# Patient Record
Sex: Female | Born: 1990 | Race: Black or African American | Hispanic: Yes | Marital: Single | Smoking: Never smoker
Health system: Southern US, Community
[De-identification: ages and names within clinical notes are randomized; demographics above are authoritative.]

## PROBLEM LIST (undated history)

## (undated) DIAGNOSIS — E282 Polycystic ovarian syndrome: Secondary | ICD-10-CM

---

## 2018-02-19 ENCOUNTER — Other Ambulatory Visit: Payer: Self-pay

## 2018-02-19 ENCOUNTER — Emergency Department (HOSPITAL_COMMUNITY)
Admission: EM | Admit: 2018-02-19 | Discharge: 2018-02-19 | Disposition: A | Discharge status: Home / Self Care | Payer: Self-pay | Attending: Emergency Medicine | Admitting: Emergency Medicine

## 2018-02-19 ENCOUNTER — Encounter (HOSPITAL_COMMUNITY): Payer: Self-pay | Admitting: Emergency Medicine

## 2018-02-19 DIAGNOSIS — O4690 Antepartum hemorrhage, unspecified, unspecified trimester: Secondary | ICD-10-CM

## 2018-02-19 DIAGNOSIS — Z3A01 Less than 8 weeks gestation of pregnancy: Secondary | ICD-10-CM | POA: Insufficient documentation

## 2018-02-19 DIAGNOSIS — O208 Other hemorrhage in early pregnancy: Secondary | ICD-10-CM | POA: Insufficient documentation

## 2018-02-19 HISTORY — DX: Polycystic ovarian syndrome: E28.2

## 2018-02-19 LAB — CBC
HEMATOCRIT: 40.8 % (ref 36.0–46.0)
Hemoglobin: 13.3 g/dL (ref 12.0–15.0)
MCH: 27.8 pg (ref 26.0–34.0)
MCHC: 32.6 g/dL (ref 30.0–36.0)
MCV: 85.4 fL (ref 80.0–100.0)
PLATELETS: 310 10*3/uL (ref 150–400)
RBC: 4.78 MIL/uL (ref 3.87–5.11)
RDW: 12.2 % (ref 11.5–15.5)
WBC: 4.3 10*3/uL (ref 4.0–10.5)
nRBC: 0 % (ref 0.0–0.2)

## 2018-02-19 LAB — ABO/RH: ABO/RH(D): O POS

## 2018-02-19 LAB — HCG, QUANTITATIVE, PREGNANCY: HCG, BETA CHAIN, QUANT, S: 19 m[IU]/mL — AB (ref <5)

## 2018-02-19 NOTE — ED Triage Notes (Signed)
Pt reports she was told on Monday that she is [redacted] weeks pregnant and was having abd cramping then, was told was normal. Pt reports has pinkish bleeding on tissue when wiping that started today.

## 2018-02-19 NOTE — ED Provider Notes (Signed)
[facility]-EMERGENCY DEPT Provider Note   CSN: [id number] Arrival date & time: 02/19/18  1735     History   Chief Complaint Chief Complaint  Patient presents with  . Abdominal Pain  . Vaginal Bleeding    HPI Erika Dennis is a 27 y.o. female who presents to the ED with c/o vaginal bleeding in early pregnancy. Patient reports she found out she was pregnant 5 days ago and estimated 6 weeks. Patient had Bhcg done at that time and told it was 98. Patient was to return in 48 hours and have it repeated. She did but does not know the results. Earlier today patient began having spotting but now it is bleeding like a period. Earlier patient reports she had bad cramps in her lower abdomen but now she has no pain.   HPI  Past Medical History:  Diagnosis Date  . PCOS (polycystic ovarian syndrome)     There are no active problems to display for this patient.   History reviewed. No pertinent surgical history.   OB History    Gravida  1   Para      Term      Preterm      AB      Living        SAB      TAB      Ectopic      Multiple      Live Births               Home Medications    Prior to Admission medications   Not on File    Family History No family history on file.  Social History Social History   Tobacco Use  . Smoking status: Never Smoker  . Smokeless tobacco: Never Used  Substance Use Topics  . Alcohol use: Not on file  . Drug use: Not on file     Allergies   Penicillins   Review of Systems Review of Systems  Gastrointestinal: Positive for abdominal pain.  Genitourinary: Positive for vaginal bleeding.  All other systems reviewed and are negative.    Physical Exam Updated Vital Signs BP 119/81 (BP Location: Left Arm)   Pulse 86   Temp 98.6 F (37 C) (Oral)   Resp 18   LMP 11/29/2017   SpO2 97%   Physical Exam  Constitutional: No distress.  Obese  HENT:  Head: Normocephalic.  Eyes: EOM are normal.    Neck: Neck supple.  Cardiovascular: Normal rate.  Pulmonary/Chest: Effort normal.  Abdominal: Soft. There is no tenderness.  Genitourinary:  Genitourinary Comments: External genitalia without lesions, small blood vaginal vault, cervix closed, no CMT, no adnexal tenderness. Unable to palpate uterus due to patient habitus.   Musculoskeletal: Normal range of motion.  Neurological: She is alert.  Skin: Skin is warm and dry.  Psychiatric: She has a normal mood and affect.  Nursing note and vitals reviewed.    ED Treatments / Results  Labs (all labs ordered are listed, but only abnormal results are displayed) Labs Reviewed  HCG, QUANTITATIVE, PREGNANCY - Abnormal; Notable for the following components:      Result Value   hCG, Beta Chain, Quant, S 19 (*)    All other components within normal limits  CBC  ABO/RH   Radiology No results found.  Procedures Procedures (including critical care time)  Medications Ordered in ED Medications - No data to display   Initial Impression / Assessment and Plan / ED Course  I have reviewed the triage vital signs and the nursing notes.  27 y.o. female G1 @ [redacted] weeks gestation by LMP here with vaginal bleeding and cramping stable for d/c without heavy bleeding and pain has resolved. Discussed with the patient lab results and most likely failed pregnancy. If symptoms worsen patient will go to [facility].   Final Clinical Impressions(s) / ED Diagnoses   Final diagnoses:  Vaginal bleeding in pregnancy    ED Discharge Orders    None       Kerrie [place], [place] 02/19/18 2221    Alvira Monday, MD 02/21/18 1523

## 2018-02-19 NOTE — Discharge Instructions (Addendum)
I have sent a message to Dr. Cherly Hensen regarding you visit today. They may call you on Monday or you can call them to schedule f/u. If you have problems before then go to [facility] Admissions.

## 2018-12-06 ENCOUNTER — Other Ambulatory Visit: Payer: Self-pay

## 2018-12-06 ENCOUNTER — Emergency Department (HOSPITAL_BASED_OUTPATIENT_CLINIC_OR_DEPARTMENT_OTHER)
Admission: EM | Admit: 2018-12-06 | Discharge: 2018-12-06 | Disposition: A | Discharge status: Home / Self Care | Payer: Self-pay | Attending: Emergency Medicine | Admitting: Emergency Medicine

## 2018-12-06 ENCOUNTER — Encounter (HOSPITAL_BASED_OUTPATIENT_CLINIC_OR_DEPARTMENT_OTHER): Payer: Self-pay | Admitting: *Deleted

## 2018-12-06 DIAGNOSIS — B3731 Acute candidiasis of vulva and vagina: Secondary | ICD-10-CM

## 2018-12-06 DIAGNOSIS — N760 Acute vaginitis: Secondary | ICD-10-CM | POA: Insufficient documentation

## 2018-12-06 DIAGNOSIS — B9689 Other specified bacterial agents as the cause of diseases classified elsewhere: Secondary | ICD-10-CM

## 2018-12-06 DIAGNOSIS — N939 Abnormal uterine and vaginal bleeding, unspecified: Secondary | ICD-10-CM

## 2018-12-06 DIAGNOSIS — B373 Candidiasis of vulva and vagina: Secondary | ICD-10-CM | POA: Insufficient documentation

## 2018-12-06 DIAGNOSIS — R030 Elevated blood-pressure reading, without diagnosis of hypertension: Secondary | ICD-10-CM | POA: Insufficient documentation

## 2018-12-06 LAB — WET PREP, GENITAL
Sperm: NONE SEEN
Trich, Wet Prep: NONE SEEN

## 2018-12-06 LAB — CBC WITH DIFFERENTIAL/PLATELET
Abs Immature Granulocytes: 0.01 10*3/uL (ref 0.00–0.07)
Basophils Absolute: 0 10*3/uL (ref 0.0–0.1)
Basophils Relative: 1 %
Eosinophils Absolute: 0.2 10*3/uL (ref 0.0–0.5)
Eosinophils Relative: 3 %
HCT: 37.3 % (ref 36.0–46.0)
Hemoglobin: 12.2 g/dL (ref 12.0–15.0)
Immature Granulocytes: 0 %
Lymphocytes Relative: 41 %
Lymphs Abs: 2.3 10*3/uL (ref 0.7–4.0)
MCH: 28.7 pg (ref 26.0–34.0)
MCHC: 32.7 g/dL (ref 30.0–36.0)
MCV: 87.8 fL (ref 80.0–100.0)
Monocytes Absolute: 0.5 10*3/uL (ref 0.1–1.0)
Monocytes Relative: 9 %
Neutro Abs: 2.6 10*3/uL (ref 1.7–7.7)
Neutrophils Relative %: 46 %
Platelets: 279 10*3/uL (ref 150–400)
RBC: 4.25 MIL/uL (ref 3.87–5.11)
RDW: 12.2 % (ref 11.5–15.5)
WBC: 5.6 10*3/uL (ref 4.0–10.5)
nRBC: 0 % (ref 0.0–0.2)

## 2018-12-06 LAB — BASIC METABOLIC PANEL
Anion gap: 10 (ref 5–15)
BUN: 12 mg/dL (ref 6–20)
CO2: 26 mmol/L (ref 22–32)
Calcium: 9 mg/dL (ref 8.9–10.3)
Chloride: 103 mmol/L (ref 98–111)
Creatinine, Ser: 0.77 mg/dL (ref 0.44–1.00)
GFR calc Af Amer: >60 mL/min (ref >60)
GFR calc non Af Amer: >60 mL/min (ref >60)
Glucose, Bld: 79 mg/dL (ref 70–99)
Potassium: 3.4 mmol/L — ABNORMAL LOW (ref 3.5–5.1)
Sodium: 139 mmol/L (ref 135–145)

## 2018-12-06 LAB — HCG, QUANTITATIVE, PREGNANCY: hCG, Beta Chain, Quant, S: <1 m[IU]/mL (ref <5)

## 2018-12-06 MED ORDER — METRONIDAZOLE 500 MG PO TABS: 500.0000 mg | ORAL_TABLET | Freq: Two times a day (BID) | ORAL | 0 refills | Status: AC

## 2018-12-06 MED ORDER — FLUCONAZOLE 150 MG PO TABS
150.0000 mg | ORAL_TABLET | Freq: Once | ORAL | Status: AC
  Administered 2018-12-06: 150 mg via ORAL
  Filled 2018-12-06: qty 1

## 2018-12-06 MED ORDER — METRONIDAZOLE 500 MG PO TABS
500.0000 mg | ORAL_TABLET | Freq: Once | ORAL | Status: AC
  Administered 2018-12-06: 500 mg via ORAL
  Filled 2018-12-06: qty 1

## 2018-12-06 NOTE — ED Notes (Signed)
Pt refused oral medications tonight, willing to start them in the morning. States "I don't like to start medications this late." Provided initial dose of flagyl and diflucan for home use.

## 2018-12-06 NOTE — ED Triage Notes (Signed)
Irregular menses. She had a positive home pregnancy test. She is having pink spotting. Hx of miscarriage in October 2019.

## 2018-12-06 NOTE — ED Notes (Signed)
Prompted to provide urine sample. Pt voided prior to arrival. Pants and underwear removed, pelvic set up for provider.

## 2018-12-06 NOTE — Discharge Instructions (Addendum)
Take medications as prescribed.  Follow-up with OB/GYN for reevaluation.  Return for any new worsening symptoms.

## 2018-12-06 NOTE — ED Provider Notes (Signed)
[facility] EMERGENCY DEPARTMENT Provider Note   CSN: [id number] Arrival date & time: 12/06/18  1857  History   Chief Complaint Chief Complaint  Patient presents with  . Vaginal Bleeding    HPI Erika Dennis is a 28 y.o. female with past medical history significant for PCOS who presents for evaluation of vaginal bleeding.  Patient states she had a faint positive pregnancy test approximately 4 days ago.  States she started with vaginal bleeding described as light spotting 2 days ago.  Patient states she has not had to wear a pad or a tampon because of bleeding is light.  She does have prior history of miscarriage in October 2019 she is a fraid that is what this is.  She is sexually active and does use protection.  Has had some mild clear/light white vaginal discharge.  Denies fever, chills, nausea, vomiting, chest pain, shortness of breath, abdominal pain, diarrhea, dysuria.  Additional aggravating or alleviating factors.  History obtained from patient and past medical records.  No interpreter is used.     HPI  Past Medical History:  Diagnosis Date  . PCOS (polycystic ovarian syndrome)     There are no active problems to display for this patient.   History reviewed. No pertinent surgical history.   OB History    Gravida  1   Para      Term      Preterm      AB      Living        SAB      TAB      Ectopic      Multiple      Live Births               Home Medications    Prior to Admission medications   Medication Sig Start Date End Date Taking? Authorizing Provider  metroNIDAZOLE (FLAGYL) 500 MG tablet Take 1 tablet (500 mg total) by mouth 2 (two) times daily. 12/06/18   Ziyan Schoon A, PA-C    Family History No family history on file.  Social History Social History   Tobacco Use  . Smoking status: Never Smoker  . Smokeless tobacco: Never Used  Substance Use Topics  . Alcohol use: Not Currently  . Drug use: Never     Allergies    Penicillins   Review of Systems Review of Systems  Constitutional: Negative.   HENT: Negative.   Respiratory: Negative.   Cardiovascular: Negative.   Gastrointestinal: Negative.   Genitourinary: Positive for menstrual problem and vaginal bleeding. Negative for decreased urine volume, difficulty urinating, dysuria, enuresis, flank pain, frequency, hematuria, pelvic pain, urgency, vaginal discharge and vaginal pain.  Musculoskeletal: Negative.   Skin: Negative.   Neurological: Negative.   All other systems reviewed and are negative.    Physical Exam Updated Vital Signs BP (!) 141/101   Pulse 83   Temp 98.4 F (36.9 C) (Oral)   Resp 20   Ht 5\' 6"  (1.676 m)   Wt 133.9 kg   SpO2 100%   Breastfeeding Unknown   BMI 47.65 kg/m   Physical Exam Vitals signs and nursing note reviewed. Exam conducted with a chaperone present.  Constitutional:      General: She is not in acute distress.    Appearance: She is well-developed. She is not ill-appearing or toxic-appearing.  HENT:     Head: Normocephalic and atraumatic.     Nose: Nose normal.     Mouth/Throat:  Mouth: Mucous membranes are moist.     Pharynx: Oropharynx is clear.  Eyes:     Pupils: Pupils are equal, round, and reactive to light.  Neck:     Musculoskeletal: Normal range of motion.  Cardiovascular:     Rate and Rhythm: Normal rate.     Pulses: Normal pulses.     Heart sounds: Normal heart sounds.  Pulmonary:     Effort: Pulmonary effort is normal. No respiratory distress.     Breath sounds: Normal breath sounds.  Abdominal:     General: There is no distension.     Comments: Soft, nontender without rebound or guarding.  Negative CVA tenderness.  No evidence of abdominal wall herniations.  Genitourinary:    Comments: Normal appearing external female genitalia without rashes or lesions, normal vaginal epithelium. Normal appearing cervix with white thin discharge. No petechiae. Cervical os is closed. There is  mild dark bleeding noted at the os. Mild Odor. Bimanual: No CMT, nontender.  No palpable adnexal masses or tenderness. Uterus midline and not fixed. Rectovaginal exam was deferred.  No cystocele or rectocele noted. No pelvic lymphadenopathy noted. Wet prep was obtained.  Cultures for gonorrhea and chlamydia collected. Exam performed with chaperone in room. Musculoskeletal: Normal range of motion.     Comments: Moves all 4 extremities without difficulty.  Skin:    General: Skin is warm and dry.     Comments: No rashes or lesions.  No redness, swelling or warmth.  Brisk capillary refill.  Neurological:     Mental Status: She is alert.     Comments: Cranial nerves II through XII grossly intact.  No facial droop.  Ambulatory that difficulty.      ED Treatments / Results  Labs (all labs ordered are listed, but only abnormal results are displayed) Labs Reviewed  WET PREP, GENITAL - Abnormal; Notable for the following components:      Result Value   Yeast Wet Prep HPF POC PRESENT (*)    Clue Cells Wet Prep HPF POC PRESENT (*)    WBC, Wet Prep HPF POC MODERATE (*)    All other components within normal limits  BASIC METABOLIC PANEL - Abnormal; Notable for the following components:   Potassium 3.4 (*)    All other components within normal limits  CBC WITH DIFFERENTIAL/PLATELET  HCG, QUANTITATIVE, PREGNANCY  URINALYSIS, ROUTINE W REFLEX MICROSCOPIC  GC/CHLAMYDIA PROBE AMP ([facility]) NOT [facility]    EKG None  Radiology No results found.  Procedures Procedures (including critical care time)  Medications Ordered in ED Medications  fluconazole (DIFLUCAN) tablet 150 mg (has no administration in time range)  metroNIDAZOLE (FLAGYL) tablet 500 mg (has no administration in time range)     Initial Impression / Assessment and Plan / ED Course  I have reviewed the triage vital signs and the nursing notes.  Pertinent labs & imaging results that were available during my care of the  patient were reviewed by me and considered in my medical decision making (see chart for details).  28 year old female appears otherwise well presents for evaluation of abnormal vaginal bleeding.  Faint positive pregnancy test 4 days ago.  Has not had to wear a pad or a tampon secondary to her light vaginal bleeding.  Abdomen soft, nontender without rebound or guarding.  GU exam within, white discharge in vaginal vault.  She does have mild dark old vaginal bleeding at the cervix.  No CMT tenderness, adnexal tenderness.  Low suspicion for PID, torsion,  TOA.  Heart and lungs clear.  Labs personally reviewed. CBC without leukocytosis, and above pain with mild hypokalemia at 3.4 Wet prep with yeast, BV and moderate WBC.  Patient does not want empiric antibiotics for GC/chlamydia. Pregnancy test negative Urinalysis--patient unable to provide sample.  Does not want to wait until she is able to urinate again.  Patient is nontoxic, nonseptic appearing, in no apparent distress.  Patient's pain and other symptoms adequately managed in emergency department.  Fluid bolus given.  Labs, imaging and vitals reviewed.  Patient does not meet the SIRS or Sepsis criteria.  On repeat exam patient does not have a surgical abdomin and there are no peritoneal signs.  No indication of appendicitis, bowel obstruction, bowel perforation, cholecystitis, diverticulitis, PID or ectopic pregnancy.   Pt understands that they have GC/Chlamydia cultures pending and that they will need to inform all sexual partners if results return positive. Pt has been treated prophylactically with azithromycin and Rocephin due to pts history, pelvic exam, and wet prep with increased WBCs. Pt has also been treated with Flagyl for Bacterial Vaginosis. Pt has been advised to not drink alcohol while on this medication.  Patient to be discharged with instructions to follow up with OBGYN/PCP. Discussed importance of using protection when sexually active.    Patient has had mildly elevated blood pressure in the department.  She is asymptomatic without headache, vision changes, unilateral weakness, nausea, vomiting, chest pain, shortness of breath.  I have low suspicion for hypertensive urgency or emergency.  I discussed with patient follow-up with PCP for reevaluation of her blood pressure.  The patient has been appropriately medically screened and/or stabilized in the ED. I have low suspicion for any other emergent medical condition which would require further screening, evaluation or treatment in the ED or require inpatient management.      Final Clinical Impressions(s) / ED Diagnoses   Final diagnoses:  Yeast vaginitis  Abnormal vaginal bleeding  Bacterial vaginosis  Elevated blood pressure reading    ED Discharge Orders         Ordered    metroNIDAZOLE (FLAGYL) 500 MG tablet  2 times daily     12/06/18 2202           Danija Gosa A, PA-C 12/06/18 2203    Vanetta MuldersZackowski, [place], MD 12/10/18 [phone]

## 2018-12-08 LAB — GC/CHLAMYDIA PROBE AMP (~~LOC~~) NOT AT ARMC
Chlamydia: NEGATIVE
Neisseria Gonorrhea: NEGATIVE

## 2020-06-06 ENCOUNTER — Encounter: Payer: Self-pay | Admitting: Obstetrics & Gynecology

## 2020-06-06 ENCOUNTER — Telehealth: Payer: Self-pay | Admitting: General Practice

## 2020-06-06 ENCOUNTER — Ambulatory Visit (INDEPENDENT_AMBULATORY_CARE_PROVIDER_SITE_OTHER): Payer: Self-pay | Admitting: Obstetrics & Gynecology

## 2020-06-06 ENCOUNTER — Other Ambulatory Visit (HOSPITAL_COMMUNITY)
Admission: RE | Admit: 2020-06-06 | Discharge: 2020-06-06 | Disposition: A | Discharge status: Home / Self Care | Payer: Self-pay | Source: Ambulatory Visit | Attending: Obstetrics & Gynecology | Admitting: Obstetrics & Gynecology

## 2020-06-06 ENCOUNTER — Other Ambulatory Visit: Payer: Self-pay

## 2020-06-06 VITALS — BP 120/79 | HR 79 | Ht 66.0 in | Wt 300.0 lb

## 2020-06-06 DIAGNOSIS — Z6841 Body Mass Index (BMI) 40.0 and over, adult: Secondary | ICD-10-CM

## 2020-06-06 DIAGNOSIS — N939 Abnormal uterine and vaginal bleeding, unspecified: Secondary | ICD-10-CM

## 2020-06-06 DIAGNOSIS — Z113 Encounter for screening for infections with a predominantly sexual mode of transmission: Secondary | ICD-10-CM | POA: Insufficient documentation

## 2020-06-06 DIAGNOSIS — E282 Polycystic ovarian syndrome: Secondary | ICD-10-CM

## 2020-06-06 DIAGNOSIS — Z01419 Encounter for gynecological examination (general) (routine) without abnormal findings: Secondary | ICD-10-CM

## 2020-06-06 NOTE — Patient Instructions (Signed)
Polycystic Ovary Syndrome  Polycystic ovarian syndrome (PCOS) is a common hormonal disorder among women of reproductive age. In most women with PCOS, small fluid-filled sacs (cysts) grow on the ovaries. PCOS can cause problems with menstrual periods and make it hard to get and stay pregnant. If this condition is not treated, it can lead to serious health problems, such as diabetes and heart disease. What are the causes? The cause of this condition is not known. It may be due to certain factors, such as:  Irregular menstrual cycle.  High levels of certain hormones.  Problems with the hormone that helps to control blood sugar (insulin).  Certain genes. What increases the risk? You are more likely to develop this condition if you:  Have a family history of PCOS or type 2 diabetes.  Are overweight, eat unhealthy foods, and are not active. These factors may cause problems with blood sugar control, which can contribute to PCOS or PCOS symptoms. What are the signs or symptoms? Symptoms of this condition include:  Ovarian cysts and sometimes pelvic pain.  Menstrual periods that are not regular or are too heavy.  Inability to get or stay pregnant.  Increased growth of hair on the face, chest, stomach, back, thumbs, thighs, or toes.  Acne or oily skin. Acne may develop during adulthood, and it may not get better with treatment.  Weight gain or obesity.  Patches of thickened and dark brown or Valeri skin on the neck, arms, breasts, or thighs. How is this diagnosed? This condition is diagnosed based on:  Your medical history.  A physical exam that includes a pelvic exam. Your health care provider may look for areas of increased hair growth on your skin.  Tests, such as: ? An ultrasound to check the ovaries for cysts and to view the lining of the uterus. ? Blood tests to check levels of sugar (glucose), female hormone (testosterone), and female hormones (estrogen and progesterone). How  is this treated? There is no cure for this condition, but treatment can help to manage symptoms and prevent more health problems from developing. Treatment varies depending on your symptoms and if you want to have a baby or if you need birth control. Treatment may include:  Making nutrition and lifestyle changes.  Taking the progesterone hormone to start a menstrual period.  Taking birth control pills to help you have regular menstrual periods.  Taking medicines such as: ? Medicines to make you ovulate, if you want to get pregnant. ? Medicine to reduce extra hair growth.  Having surgery in severe cases. This may involve making small holes in one or both of your ovaries. This decreases the amount of testosterone that your body makes. Follow these instructions at home:  Take over-the-counter and prescription medicines only as told by your health care provider.  Follow a healthy meal plan that includes lean proteins, complex carbohydrates, fresh fruits and vegetables, low-fat dairy products, healthy fats, and fiber.  If you are overweight, lose weight as told by your health care provider. Your health care provider can determine how much weight loss is best for you and can help you lose weight safely.  Keep all follow-up visits. This is important. Contact a health care provider if:  Your symptoms do not get better with medicine.  Your symptoms get worse or you develop new symptoms. Summary  Polycystic ovarian syndrome (PCOS) is a common hormonal disorder among women of reproductive age.  PCOS can cause problems with menstrual periods and make it hard   to get and stay pregnant.  If this condition is not treated, it can lead to serious health problems, such as diabetes and heart disease.  There is no cure for this condition, but treatment can help to manage symptoms and prevent more health problems from developing. This information is not intended to replace advice given to you by your  health care provider. Make sure you discuss any questions you have with your health care provider. Document Revised: 09/21/2019 Document Reviewed: 09/21/2019 Elsevier Patient Education  2021 Elsevier Inc.    Diet for Polycystic Ovary Syndrome Polycystic ovary syndrome (PCOS) is a common hormonal disorder that affects a woman's reproductive system. It can cause problems with menstrual periods and make it hard to get and stay pregnant. Changing what you eat can help your hormones reach normal levels, improve your health, and help you better manage PCOS. Following a balanced diet can help you lose weight and improve the way that your body uses the hormone insulin to control blood sugar. This may include:  Eating low-fat (lean) proteins, complex carbohydrates, fresh fruits and vegetables, low-fat dairy products, healthy fats, and fiber.  Cutting down on calories.  Exercising regularly. What are tips for following this plan?  Follow a balanced diet for meals and snacks. Eat breakfast, lunch, dinner, and one or two snacks every day.  Include protein in each meal and snack.  Choose whole grains instead of products that are made with refined flour.  Eat a variety of foods.  Exercise regularly as told by your health care provider. Aim to do at least 30 minutes of exercise on most days of the week.  If you are overweight or obese: ? Pay attention to how many calories you eat. Cutting down on calories can help you lose weight. ? Work with your health care provider or a dietitian to figure out how many calories you need each day. What foods should I eat? Fruits Include a variety of colors and types. All fruits are helpful for PCOS. Vegetables Include a variety of colors and types. All vegetables are helpful for PCOS. Grains Whole grains, such as whole wheat. Whole-grain breads, crackers, cereals, and pasta. Unsweetened oatmeal. Bulgur, barley, quinoa, and brown rice. Tortillas made from corn  or whole-wheat flour. Meats and other proteins Lean proteins, such as fish, chicken, beans, eggs, and tofu. Dairy Low-fat dairy products, such as skim milk, cheese sticks, and yogurt. Beverages Low-fat or fat-free drinks, such as water, low-fat milk, sugar-free drinks, and small amounts of 100% fruit juice. Seasonings and condiments Ketchup. Mustard. Barbecue sauce. Relish. Low-fat or fat-free mayonnaise. Fats and oils Olive oil or canola oil. Walnuts and almonds. The items listed above may not be a complete list of recommended foods and beverages. Contact a dietitian for more options.   What foods should I avoid? Foods that are high in calories or fat, especially saturated or trans fats. Fried foods. Sweets. Products that are made from refined white flour, including white bread, pastries, white rice, and pasta. The items listed above may not be a complete list of foods and beverages to avoid. Contact a dietitian for more information. Summary  PCOS is a hormonal imbalance that affects a woman's reproductive system. It can cause problems with menstrual periods and make it hard to get and stay pregnant.  You can help to manage your PCOS by exercising regularly and eating a healthy, varied diet of vegetables, fruit, whole grains, lean protein, and low-fat dairy products.  Changing what you eat   can improve the way that your body uses insulin, help your hormones reach normal levels, and help you lose weight. This information is not intended to replace advice given to you by your health care provider. Make sure you discuss any questions you have with your health care provider. Document Revised: 09/21/2019 Document Reviewed: 09/21/2019 Elsevier Patient Education  2021 Elsevier Inc.    

## 2020-06-06 NOTE — Telephone Encounter (Signed)
Financial application given to patient. 

## 2020-06-06 NOTE — Progress Notes (Signed)
Subjective:     Erika Dennis is a 30 y.o. female here for a routine exam. G1P0010. S/p SAB in Oct 2019. Pt with h/o PCOs. 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 Current complaints: Pt has been bleeding since June. It will stop for 1-2 days and then return heavy with clots. Pt was on OCPs when she was 17 and she reports being on multiple different pills. She had cystic acne at the time and is worried about that returning. She is not interested in any hormonal meds to control her sx. She has been seeking out natural remedies to mange her sx.     Pt prev sexually active. Now practicing abstinence. No STI screen since she was last sexually active.  She has changed her diet and she is currently completing the [employer]. She has been exercising recently.     Gynecologic History No LMP recorded. (Menstrual status: Irregular Periods). Contraception: abstinence Last Pap: unknown.   Last mammogram: n/a  Obstetric History OB History  Gravida Para Term Preterm AB Living  1       1    SAB IAB Ectopic Multiple Live Births  1            # Outcome Date GA Lbr Len/2nd Weight Sex Delivery Anes PTL Lv  1 SAB 01/2018           The following portions of the patient's history were reviewed and updated as appropriate: allergies, current medications, past family history, past medical history, past social history, past surgical history and problem list.  Review of Systems Pertinent items are noted in HPI.    Objective:  BP 120/79   Pulse 79   Ht 5\' 6"  (1.676 m)   Wt 300 lb (136.1 kg)   BMI 48.42 kg/m  General Appearance:    Alert, cooperative, no distress, appears stated age  Head:    Normocephalic, without obvious abnormality, atraumatic  Eyes:    conjunctiva/corneas clear, EOM's intact, both eyes  Ears:    Normal external ear canals, both ears  Nose:   Nares normal, septum midline, mucosa normal, no drainage    or sinus tenderness  Throat:   Lips, mucosa, and tongue normal; teeth and gums normal  Neck:   Supple,  symmetrical, trachea midline, no adenopathy;    thyroid:  no enlargement/tenderness/nodules; acanthosis nigricans noted.   Back:     Symmetric, no curvature, ROM normal, no CVA tenderness  Lungs:     respirations unlabored  Chest Wall:    No tenderness or deformity   Heart:    Regular rate and rhythm  Breast Exam:    No tenderness, masses, or nipple abnormality  Abdomen:     Soft, non-tender, bowel sounds active all four quadrants,    no masses, no organomegaly  Genitalia:    Normal female without lesion, discharge or tenderness     Extremities:   Extremities normal, atraumatic, no cyanosis or edema  Pulses:   2+ and symmetric all extremities  Skin:   Skin color, texture, turgor normal, no rashes or lesions     Assessment:    Healthy female exam.   H/o PCOS AUB- daily bleeding since Jun. I am concerned about the daily bleeding. I have offered the pts OCPs, LnIUD and Megace. Pt does not want any hormonal meds. Wants to try natural remedies.    BMI 48- I have reviewed weight management and the relationship to PCOS. Pt currently has a trainer and has adjusted her diet.  She wants to take this approach to managing her PCOS. We discussed metabolic syndrome and the risks.        Plan:   Labs: TSH, CBC Needs PAP. Referred to [facility] Pt agreed that if she is anemic, she will agree to OCPs or Megace. STI screen   Nasira Janusz L. Harraway-Smith, M.D., Evern Core

## 2020-06-06 NOTE — Progress Notes (Signed)
Pt states she has been bleeding since June 2021.  [name] l [name], CMA

## 2020-06-08 LAB — CBC
Hematocrit: 38.9 % (ref 34.0–46.6)
Hemoglobin: 12.7 g/dL (ref 11.1–15.9)
MCH: 27.8 pg (ref 26.6–33.0)
MCHC: 32.6 g/dL (ref 31.5–35.7)
MCV: 85 fL (ref 79–97)
Platelets: 268 10*3/uL (ref 150–450)
RBC: 4.57 x10E6/uL (ref 3.77–5.28)
RDW: 12.9 % (ref 11.7–15.4)
WBC: 3.2 10*3/uL — ABNORMAL LOW (ref 3.4–10.8)

## 2020-06-08 LAB — TSH: TSH: 1.27 u[IU]/mL (ref 0.450–4.500)

## 2020-06-10 LAB — CERVICOVAGINAL ANCILLARY ONLY
Bacterial Vaginitis (gardnerella): NEGATIVE
Candida Glabrata: NEGATIVE
Candida Vaginitis: NEGATIVE
Chlamydia: NEGATIVE
Comment: NEGATIVE
Comment: NEGATIVE
Comment: NEGATIVE
Comment: NEGATIVE
Comment: NEGATIVE
Comment: NORMAL
Neisseria Gonorrhea: NEGATIVE
Trichomonas: NEGATIVE

## 2020-06-11 ENCOUNTER — Telehealth: Payer: Self-pay

## 2020-06-11 NOTE — Telephone Encounter (Signed)
Patient called and made aware that TSH and iron levels are within normal limits. Patient also made aware that her GC./ Chlymydia results came back negative. Armandina Stammer RN

## 2020-06-11 NOTE — Telephone Encounter (Signed)
-----   Message from Willodean Rosenthal, MD sent at 06/10/2020 11:01 AM EST ----- Please call pt. She is not anemic and her TSH is WNL.   Thanks,   Clh-S

## 2020-06-17 ENCOUNTER — Telehealth: Payer: Self-pay

## 2020-06-17 NOTE — Telephone Encounter (Signed)
Telephoned patient at home mobile number. Left voice message with [facility] contact information.

## 2020-06-26 ENCOUNTER — Telehealth: Payer: Self-pay | Admitting: General Practice

## 2020-06-26 ENCOUNTER — Telehealth: Payer: Self-pay

## 2020-06-26 NOTE — Telephone Encounter (Signed)
Attempted to contact patient regarding referral per [facility] (Dr. Erin Fulling). Left message requesting return call.

## 2020-06-26 NOTE — Telephone Encounter (Signed)
-----   Message from [facility], LPN sent at 07/01/1060 12:40 PM EST ----- Regarding: RE: Pap Smear Hi,    Attempted to contact patient, left message on voicemail, requesting return call.  Thanks,  [employer], LPN ([facility]) [phone] ----- Message ----- From: Jillene Bucks Sent: 06/26/2020   8:16 AM EST To: Fidel Levy McGill, LPN Subject: FW: Pap Smear                                  Patient needs pap. ----- Message ----- From: Marti Sleigh, NT Sent: 06/06/2020   4:13 PM EST To: Jillene Bucks Subject: Pap Smear                                      Patient does not have insurance.  Can you please call patient with next available for free cervical screening?  Thank you

## 2020-06-26 NOTE — Telephone Encounter (Signed)
[facility] attempted to call patient to schedule.  Message was left on VM from [facility] to call to schedule appt.

## 2020-06-27 ENCOUNTER — Encounter (HOSPITAL_BASED_OUTPATIENT_CLINIC_OR_DEPARTMENT_OTHER): Payer: Self-pay | Admitting: Emergency Medicine

## 2020-06-27 ENCOUNTER — Emergency Department (HOSPITAL_BASED_OUTPATIENT_CLINIC_OR_DEPARTMENT_OTHER): Payer: Self-pay

## 2020-06-27 ENCOUNTER — Emergency Department (HOSPITAL_BASED_OUTPATIENT_CLINIC_OR_DEPARTMENT_OTHER)
Admission: EM | Admit: 2020-06-27 | Discharge: 2020-06-27 | Disposition: A | Discharge status: Home / Self Care | Payer: Self-pay | Attending: Emergency Medicine | Admitting: Emergency Medicine

## 2020-06-27 ENCOUNTER — Other Ambulatory Visit: Payer: Self-pay

## 2020-06-27 DIAGNOSIS — R002 Palpitations: Secondary | ICD-10-CM | POA: Insufficient documentation

## 2020-06-27 DIAGNOSIS — R079 Chest pain, unspecified: Secondary | ICD-10-CM | POA: Insufficient documentation

## 2020-06-27 LAB — BASIC METABOLIC PANEL
Anion gap: 9 (ref 5–15)
BUN: 7 mg/dL (ref 6–20)
CO2: 27 mmol/L (ref 22–32)
Calcium: 8.7 mg/dL — ABNORMAL LOW (ref 8.9–10.3)
Chloride: 103 mmol/L (ref 98–111)
Creatinine, Ser: 0.75 mg/dL (ref 0.44–1.00)
GFR, Estimated: >60 mL/min (ref >60)
Glucose, Bld: 86 mg/dL (ref 70–99)
Potassium: 3.4 mmol/L — ABNORMAL LOW (ref 3.5–5.1)
Sodium: 139 mmol/L (ref 135–145)

## 2020-06-27 LAB — CBC WITH DIFFERENTIAL/PLATELET
Abs Immature Granulocytes: 0.01 10*3/uL (ref 0.00–0.07)
Basophils Absolute: 0 10*3/uL (ref 0.0–0.1)
Basophils Relative: 1 %
Eosinophils Absolute: 0.1 10*3/uL (ref 0.0–0.5)
Eosinophils Relative: 2 %
HCT: 33.8 % — ABNORMAL LOW (ref 36.0–46.0)
Hemoglobin: 11.4 g/dL — ABNORMAL LOW (ref 12.0–15.0)
Immature Granulocytes: 0 %
Lymphocytes Relative: 46 %
Lymphs Abs: 2.3 10*3/uL (ref 0.7–4.0)
MCH: 28.9 pg (ref 26.0–34.0)
MCHC: 33.7 g/dL (ref 30.0–36.0)
MCV: 85.6 fL (ref 80.0–100.0)
Monocytes Absolute: 0.4 10*3/uL (ref 0.1–1.0)
Monocytes Relative: 8 %
Neutro Abs: 2.2 10*3/uL (ref 1.7–7.7)
Neutrophils Relative %: 43 %
Platelets: 321 10*3/uL (ref 150–400)
RBC: 3.95 MIL/uL (ref 3.87–5.11)
RDW: 12.9 % (ref 11.5–15.5)
WBC: 5.1 10*3/uL (ref 4.0–10.5)
nRBC: 0 % (ref 0.0–0.2)

## 2020-06-27 LAB — TSH: TSH: 0.994 u[IU]/mL (ref 0.350–4.500)

## 2020-06-27 LAB — TROPONIN I (HIGH SENSITIVITY): Troponin I (High Sensitivity): <2 ng/L (ref <18)

## 2020-06-27 NOTE — ED Triage Notes (Signed)
Patient arrived via POV c/o chest pain with palpitations x 3 days. Patient states noticing intermittent palpitations since January, but no chest pain until 3 days pta. Patient states radiation to left shoulder. Patient states worst pain as 7/10 with 0/10 now. Patient is AO x 4, VS WDL, normal gait.

## 2020-06-27 NOTE — ED Provider Notes (Signed)
[facility] EMERGENCY DEPARTMENT Provider Note   CSN: [id number] Arrival date & time: 06/27/20  2003     History Chief Complaint  Patient presents with  . Chest Pain    Erika Dennis is a 30 y.o. female.  HPI Patient presents with chest pain and palpitations. Sharp chest pain comes and goes. Not exertional.  She does exercise a lot and does not have any pain with the exercise.  Able to exercise without limitations.  However at times does feel palpitations.  States has had on and off for the last 3 months but has had some episodes that last longer.  Not feeling the heart race now.  Has been losing weight.  Not on any weight loss medications.  History of polycystic ovarian syndrome and states her menses has been somewhat heavy recently.  Denies possibly of pregnancy.  No swelling in her legs.    Past Medical History:  Diagnosis Date  . PCOS (polycystic ovarian syndrome)     There are no problems to display for this patient.   History reviewed. No pertinent surgical history.   OB History    Gravida  1   Para      Term      Preterm      AB  1   Living        SAB  1   IAB      Ectopic      Multiple      Live Births              No family history on file.  Social History   Tobacco Use  . Smoking status: Never Smoker  . Smokeless tobacco: Never Used  Vaping Use  . Vaping Use: Never used  Substance Use Topics  . Alcohol use: Not Currently  . Drug use: Never    Home Medications Prior to Admission medications   Medication Sig Start Date End Date Taking? Authorizing Provider  metroNIDAZOLE (FLAGYL) 500 MG tablet Take 1 tablet (500 mg total) by mouth 2 (two) times daily. Patient not taking: Reported on 06/06/2020 12/06/18   Henderly, Britni A, PA-C    Allergies    Penicillins  Review of Systems   Review of Systems  Constitutional: Negative for appetite change.  HENT: Negative for congestion.   Respiratory: Positive for shortness of  breath.   Cardiovascular: Positive for chest pain and palpitations.  Gastrointestinal: Negative for abdominal pain.  Genitourinary: Negative for flank pain.  Musculoskeletal: Negative for back pain.  Skin: Negative for rash.  Neurological: Negative for weakness.  Psychiatric/Behavioral: Negative for confusion.    Physical Exam Updated Vital Signs BP 115/71 (BP Location: Right Arm)   Pulse 84   Temp 98.2 F (36.8 C) (Oral)   Resp 20   Ht 5\' 6"  (1.676 m)   Wt 131.5 kg   SpO2 100%   BMI 46.81 kg/m   Physical Exam Vitals and nursing note reviewed.  Constitutional:      Appearance: She is well-developed.  HENT:     Head: Atraumatic.  Cardiovascular:     Rate and Rhythm: Normal rate and regular rhythm.  Pulmonary:     Breath sounds: No wheezing or rhonchi.  Chest:     Chest wall: No tenderness.  Abdominal:     Tenderness: There is no abdominal tenderness.  Musculoskeletal:     Right lower leg: No edema.     Left lower leg: No edema.  Skin:  General: Skin is warm.     Capillary Refill: Capillary refill takes less than 2 seconds.  Neurological:     Mental Status: She is alert and oriented to person, place, and time.     ED Results / Procedures / Treatments   Labs (all labs ordered are listed, but only abnormal results are displayed) Labs Reviewed  BASIC METABOLIC PANEL - Abnormal; Notable for the following components:      Result Value   Potassium 3.4 (*)    Calcium 8.7 (*)    All other components within normal limits  CBC WITH DIFFERENTIAL/PLATELET - Abnormal; Notable for the following components:   Hemoglobin 11.4 (*)    HCT 33.8 (*)    All other components within normal limits  PREGNANCY, URINE  TSH  TROPONIN I (HIGH SENSITIVITY)  TROPONIN I (HIGH SENSITIVITY)    EKG EKG Interpretation  Date/Time:  Thursday June 27 2020 20:14:06 EST Ventricular Rate:  82 PR Interval:  162 QRS Duration: 82 QT Interval:  368 QTC Calculation: 429 R  Axis:   78 Text Interpretation: Normal sinus rhythm with sinus arrhythmia Normal ECG Confirmed by Benjiman Core [phone]) on 06/27/2020 10:20:28 PM   Radiology DG Chest 2 View  Result Date: 06/27/2020 CLINICAL DATA:  Patient arrived via POV c/o chest pain with palpitations x 3 days. EXAM: CHEST - 2 VIEW COMPARISON:  None. FINDINGS: The cardiomediastinal contours are within normal limits. The lungs are clear. No pneumothorax or pleural effusion. No acute finding in the visualized skeleton. IMPRESSION: No active cardiopulmonary disease. Electronically Signed   By: Emmaline Kluver M.D.   On: 06/27/2020 21:06    Procedures Procedures   Medications Ordered in ED Medications - No data to display  ED Course  I have reviewed the triage vital signs and the nursing notes.  Pertinent labs & imaging results that were available during my care of the patient were reviewed by me and considered in my medical decision making (see chart for details).    MDM Rules/Calculators/A&P                          Patient presents with chest pain.  Sharp.  Not exertional.  Doubt cardiac cause of the pain.  Doubt pulmonary embolism. Also has been having palpitations.  Come and go.  Did not show up on EKG or while she was here.  Potentially be something as an SVT or in A. fib.  Needs outpatient follow-up.  Mild anemia but do not think that is enough is an anemia to cause severe palpitations.  Will need outpatient follow-up potentially with cardiology for monitoring.  Discharge home. Final Clinical Impression(s) / ED Diagnoses Final diagnoses:  Nonspecific chest pain  Palpitations    Rx / DC Orders ED Discharge Orders    None       Benjiman Core, MD 06/27/20 [phone]

## 2020-07-04 ENCOUNTER — Telehealth: Payer: Self-pay | Admitting: General Practice

## 2020-07-11 ENCOUNTER — Telehealth: Payer: Self-pay | Admitting: General Practice

## 2020-07-11 NOTE — Telephone Encounter (Signed)
Left message on VM for patient to give our office a call to schedule 3 month f/u (May 2022) with Dr. Erin Fulling.

## 2021-10-29 ENCOUNTER — Encounter: Payer: Self-pay | Admitting: Obstetrics & Gynecology

## 2021-10-29 ENCOUNTER — Ambulatory Visit (INDEPENDENT_AMBULATORY_CARE_PROVIDER_SITE_OTHER): Payer: Self-pay | Admitting: Obstetrics & Gynecology

## 2021-10-29 VITALS — BP 98/61 | HR 72 | Wt 316.0 lb

## 2021-10-29 DIAGNOSIS — E282 Polycystic ovarian syndrome: Secondary | ICD-10-CM

## 2021-10-29 DIAGNOSIS — Z789 Other specified health status: Secondary | ICD-10-CM

## 2021-10-29 DIAGNOSIS — Z01419 Encounter for gynecological examination (general) (routine) without abnormal findings: Secondary | ICD-10-CM

## 2021-10-29 NOTE — Progress Notes (Signed)
Subjective:     Erika Dennis is a 31 y.o. female here for a routine exam.  Current complaints: none. Not sexually active. Has a purity ring. Eats healthy but cant lose weith. 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    Gynecologic History Patient's last menstrual period was 10/21/2021 (exact date). Contraception: abstinence Last Pap: >3 years  Last mammogram: n/a  Obstetric History OB History  Gravida Para Term Preterm AB Living  1       1    SAB IAB Ectopic Multiple Live Births  1            # Outcome Date GA Lbr Len/2nd Weight Sex Delivery Anes PTL Lv  1 SAB 01/2018           The following portions of the patient's history were reviewed and updated as appropriate: allergies, current medications, past family history, past medical history, past social history, past surgical history, and problem list.  Review of Systems Pertinent items are noted in HPI.    Objective:  BP 98/61   Pulse 72   Wt (!) 316 lb (143.3 kg)   LMP 10/21/2021 (Exact Date)   BMI 51.00 kg/m   General Appearance:    Alert, cooperative, no distress, appears stated age  Head:    Normocephalic, without obvious abnormality, atraumatic  Eyes:    conjunctiva/corneas clear, EOM's intact, both eyes  Ears:    Normal external ear canals, both ears  Nose:   Nares normal, septum midline, mucosa normal, no drainage    or sinus tenderness  Throat:   Lips, mucosa, and tongue normal; teeth and gums normal  Neck:   Supple, symmetrical, trachea midline, no adenopathy;    thyroid:  no enlargement/tenderness/nodules  Back:     Symmetric, no curvature, ROM normal, no CVA tenderness  Lungs:     respirations unlabored  Chest Wall:    No tenderness or deformity   Heart:    Regular rate and rhythm  Breast Exam:    No tenderness, masses, or nipple abnormality  Abdomen:     Soft, non-tender, bowel sounds active all four quadrants,    no masses, no organomegaly  Genitalia:    Normal female without lesion, discharge or tenderness      Extremities:   Extremities normal, atraumatic, no cyanosis or edema  Pulses:   2+ and symmetric all extremities  Skin:   Skin color, texture, turgor normal, no rashes or lesions     Assessment:    Healthy female exam.    Plan:   Diagnoses and all orders for this visit:  Well female exam with routine gynecological exam  Advised about management of weight -     TSH  PCOS (polycystic ovarian syndrome) -     TSH   Will get PAP at [facility]  F/u in 1 year or sooner prn.   Lyndel Sarate L. Harraway-Smith, M.D., [facility]n Core

## 2022-06-24 ENCOUNTER — Other Ambulatory Visit: Payer: Self-pay | Admitting: Hematology and Oncology

## 2022-06-24 DIAGNOSIS — Z124 Encounter for screening for malignant neoplasm of cervix: Secondary | ICD-10-CM

## 2022-06-24 NOTE — Progress Notes (Signed)
Patient: Erika Dennis           Date of Birth: 10-Jul-1990           MRN: [id number] Visit Date: 06/24/2022 PCP: Patient, No Pcp Per     Cervical Exam Pap smear completed: Pap test Abnormal Observations: Normal exam. Recommendations: Follow up in 5 years if normal and negative HPV.      Patient's History There are no problems to display for this patient.  Past Medical History:  Diagnosis Date   PCOS (polycystic ovarian syndrome)     No family history on file.  Social History   Occupational History   Not on file  Tobacco Use   Smoking status: Never   Smokeless tobacco: Never  Vaping Use   Vaping Use: Never used  Substance and Sexual Activity   Alcohol use: Not Currently   Drug use: Never   Sexual activity: Not Currently    Birth control/protection: None

## 2022-07-01 LAB — CYTOLOGY - PAP
Comment: NEGATIVE
Diagnosis: NEGATIVE
High risk HPV: NEGATIVE

## 2022-07-02 NOTE — Progress Notes (Deleted)
Patient: Erika Dennis           Date of Birth: 17-Dec-1990           MRN: [id number] Visit Date: 06/24/2022 PCP: Patient, No Pcp Per  Cervical Cancer Screening Do you smoke?: No Have you ever had or been told you have an allergy to latex products?: No Marital status: Single Date of last pap smear: Never Date of last menstrual period: 06/06/22 Number of pregnancies: 1 Number of births: 0 Have you ever had any of the following? Hysterectomy: No Tubal ligation (tubes tied): No Abnormal bleeding: Yes Abnormal pap smear: No Venereal warts: No A sex partner with venereal warts: No  Cervical Exam Exam not completed.  Patient's History There are no problems to display for this patient.  Past Medical History:  Diagnosis Date   PCOS (polycystic ovarian syndrome)     No family history on file.  Social History   Occupational History   Not on file  Tobacco Use   Smoking status: Never   Smokeless tobacco: Never  Vaping Use   Vaping Use: Never used  Substance and Sexual Activity   Alcohol use: Not Currently   Drug use: Never   Sexual activity: Not Currently    Birth control/protection: None

## 2022-08-24 IMAGING — DX DG CHEST 2V
2 series · 2 of 2 positions shown · non-contrast
Comparison: None.

CLINICAL DATA: Patient arrived via POV c/o chest pain with
palpitations x 3 days.

EXAM:
CHEST - 2 VIEW

[chest pa]
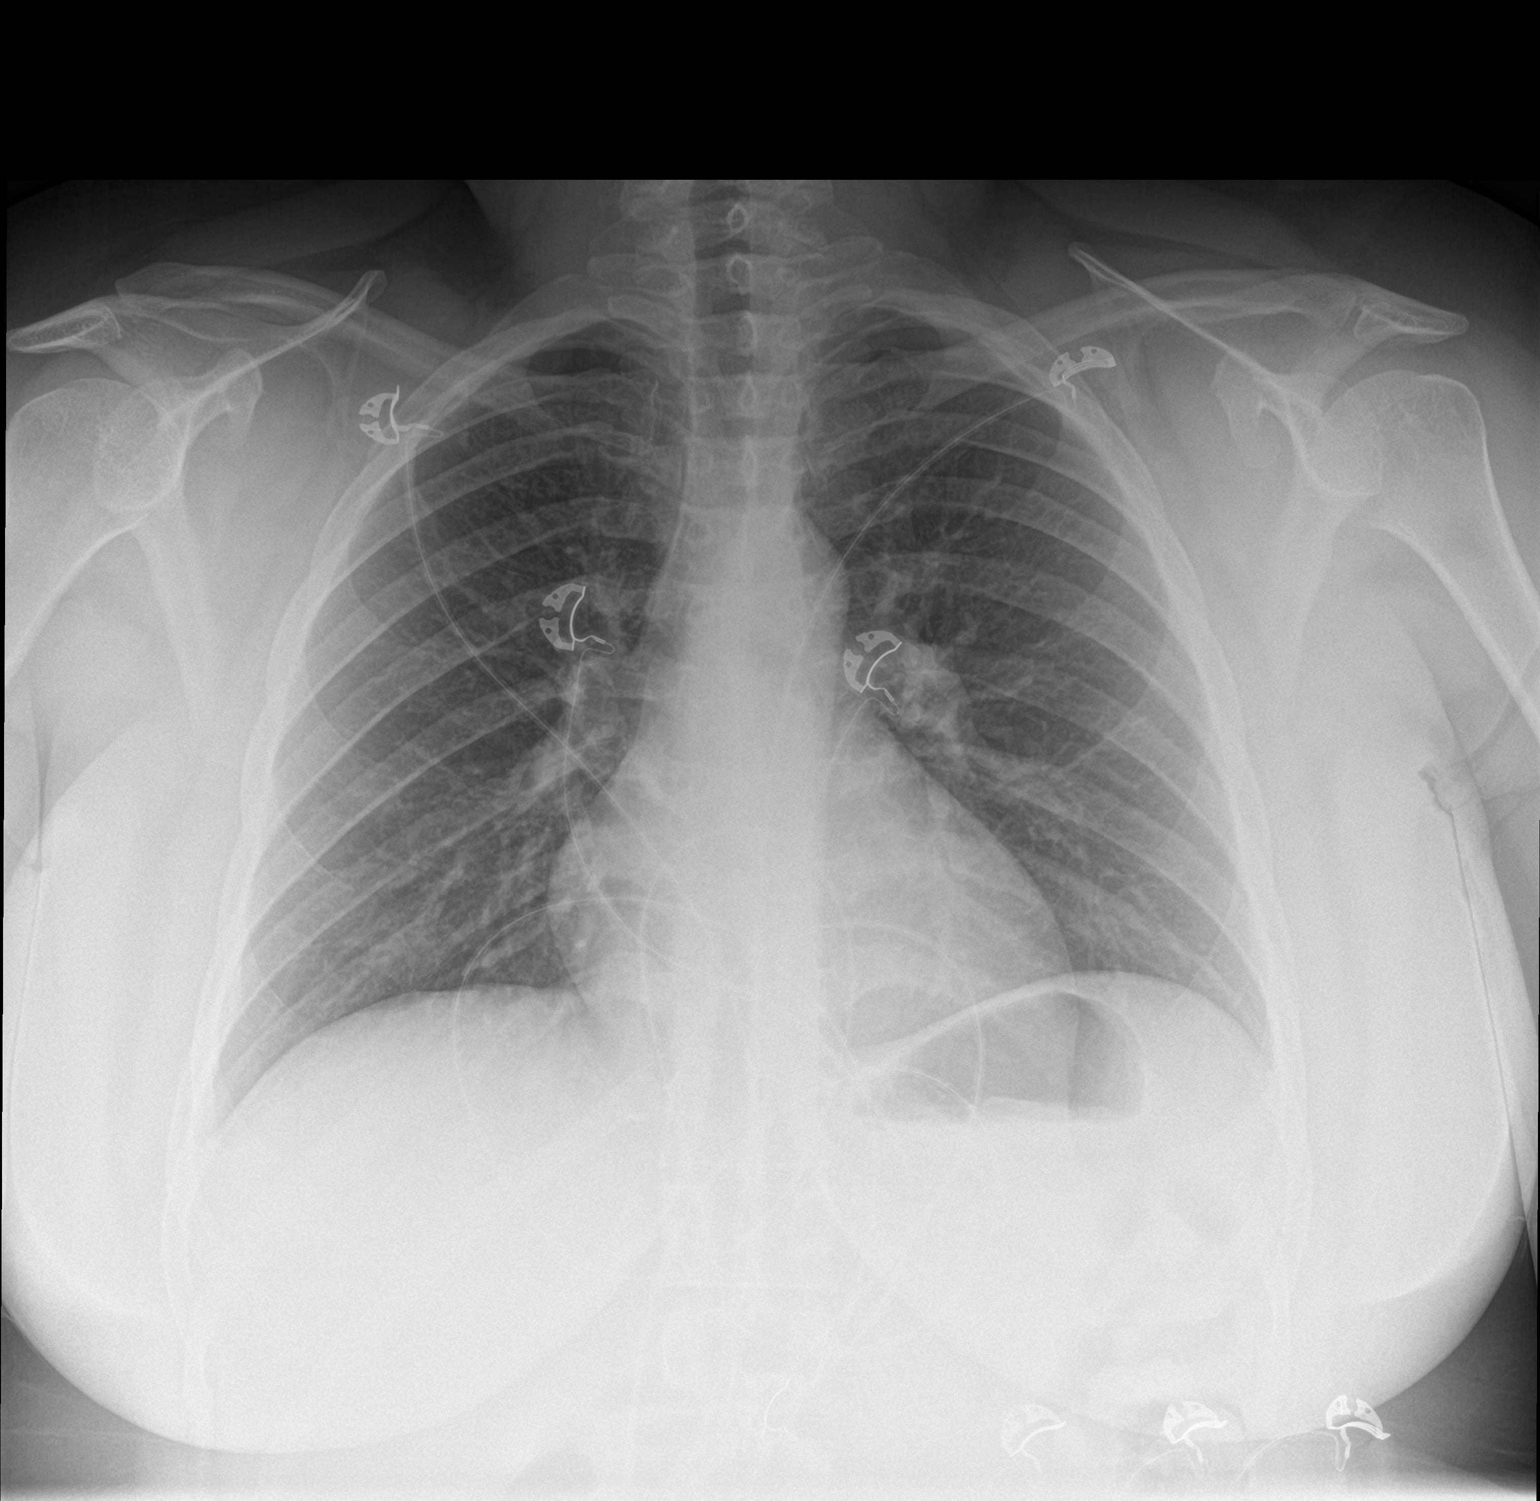

[chest lat]
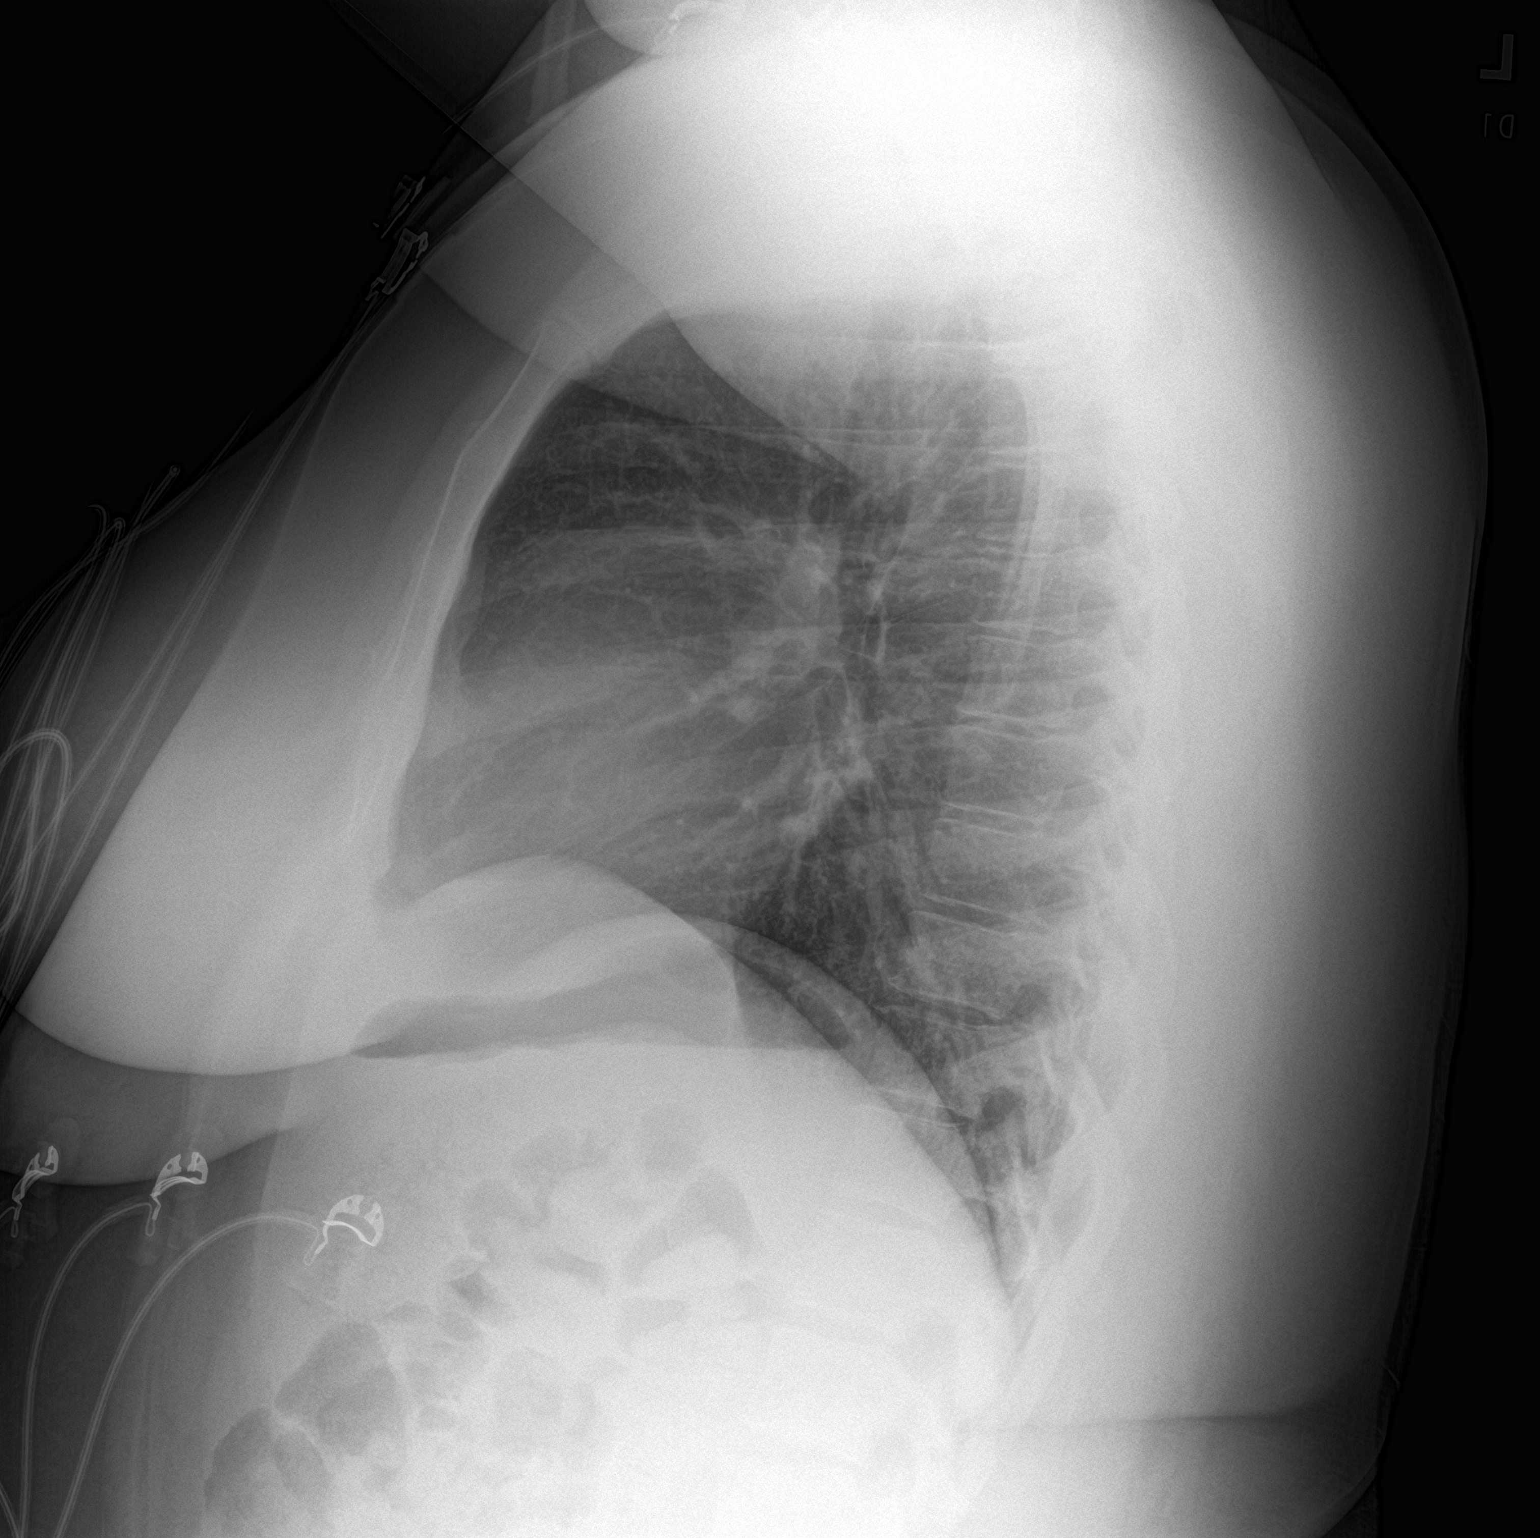

[2 of 2 positions shown; findings below may reference images not displayed]

FINDINGS: The cardiomediastinal contours are within normal limits. The lungs
are clear. No pneumothorax or pleural effusion. No acute finding in
the visualized skeleton.
IMPRESSION: No active cardiopulmonary disease.

## 2023-03-15 ENCOUNTER — Telehealth: Payer: Self-pay

## 2023-03-15 NOTE — Telephone Encounter (Signed)
Pt advised to come in to office for a UPT and then go from there after the results.    Pt verbalized understanding and placed on the nurse schedule for UPT

## 2023-03-15 NOTE — Telephone Encounter (Signed)
-----   Message from Neale Burly sent at 03/15/2023  9:24 AM EST ----- Regarding: Possible New OB Patient said her period is 7 days late. Her lmp was 02/07/23.   Patient would like to get possible lab work and would like to schedule an intake as well. Tried to do a pregnancy test at home and misread instructions. States she's having symptoms.   Best contact number is [phone].

## 2023-03-16 ENCOUNTER — Ambulatory Visit: Payer: Self-pay

## 2023-03-16 VITALS — BP 118/74 | HR 84 | Wt 301.0 lb

## 2023-03-16 DIAGNOSIS — N926 Irregular menstruation, unspecified: Secondary | ICD-10-CM

## 2023-03-16 LAB — POCT URINE PREGNANCY: Preg Test, Ur: NEGATIVE

## 2023-03-16 NOTE — Progress Notes (Unsigned)
Erika Dennis here for a UPT. Pt had a negative upt at home. LMP is 02/07/2023.       UPT in office Negative. Pt reports pregnancy-like symptoms such as dizziness, bloating, mild cramping, nausea.     Reviewed medications and informed to start a PNV, if not already.

## 2023-03-16 NOTE — Progress Notes (Unsigned)
Erika Dennis here for a UPT. Pt had a {Desc; negative/positive:13464} upt at home. LMP is ***.     UPT in office {DESC;Negative/Positive:115700014}.    Reviewed medications and informed to start a PNV, if not already. Pt to follow up in {1-6 weeks:20341} for New OB visit.

## 2023-03-18 LAB — BETA HCG QUANT (REF LAB): hCG Quant: <1 m[IU]/mL

## 2023-09-29 ENCOUNTER — Ambulatory Visit: Payer: Self-pay | Admitting: Obstetrics & Gynecology

## 2023-10-13 ENCOUNTER — Ambulatory Visit: Payer: Self-pay | Admitting: Obstetrics & Gynecology

## 2023-10-20 ENCOUNTER — Ambulatory Visit: Payer: Self-pay | Admitting: Obstetrics & Gynecology

## 2023-10-20 ENCOUNTER — Other Ambulatory Visit (HOSPITAL_COMMUNITY)
Admission: RE | Admit: 2023-10-20 | Discharge: 2023-10-20 | Disposition: A | Discharge status: Home / Self Care | Payer: Self-pay | Source: Ambulatory Visit | Attending: Obstetrics & Gynecology | Admitting: Obstetrics & Gynecology

## 2023-10-20 VITALS — BP 116/89 | HR 80 | Ht 66.0 in | Wt 314.0 lb

## 2023-10-20 DIAGNOSIS — Z1331 Encounter for screening for depression: Secondary | ICD-10-CM

## 2023-10-20 DIAGNOSIS — Z01419 Encounter for gynecological examination (general) (routine) without abnormal findings: Secondary | ICD-10-CM

## 2023-10-20 DIAGNOSIS — N912 Amenorrhea, unspecified: Secondary | ICD-10-CM

## 2023-10-20 DIAGNOSIS — Z113 Encounter for screening for infections with a predominantly sexual mode of transmission: Secondary | ICD-10-CM

## 2023-10-20 DIAGNOSIS — Z3202 Encounter for pregnancy test, result negative: Secondary | ICD-10-CM

## 2023-10-20 DIAGNOSIS — Z6841 Body Mass Index (BMI) 40.0 and over, adult: Secondary | ICD-10-CM

## 2023-10-20 NOTE — Progress Notes (Unsigned)
 Subjective:     Erika Dennis is a 33 y.o. female here for a routine exam.  Current complaints: Multiple attempts to lose weight without success. Was sexually active in 2023-06-10 after the death of her mother. Unprotected.  Wants STI screen.     Gynecologic History Patient's last menstrual period was 07/28/2023 (exact date). Contraception: abstinence Last Pap: 06/24/2022. Results were: normal Last mammogram: n/a.   Obstetric History OB History  Gravida Para Term Preterm AB Living  1    1   SAB IAB Ectopic Multiple Live Births  1        # Outcome Date GA Lbr Len/2nd Weight Sex Type Anes PTL Lv  1 SAB 01/2018           The following portions of the patient's history were reviewed and updated as appropriate: allergies, current medications, past family history, past medical history, past social history, past surgical history, and problem list.  Review of Systems Pertinent items are noted in HPI.    Objective:  BP 116/89   Pulse 80   Ht 5' 6 (1.676 m)   Wt (!) 314 lb (142.4 kg)   LMP 07/28/2023 (Exact Date)   BMI 50.68 kg/m   General Appearance:    Alert, cooperative, no distress, appears stated age  Head:    Normocephalic, without obvious abnormality, atraumatic  Eyes:    conjunctiva/corneas clear, EOM's intact, both eyes  Ears:    Normal external ear canals, both ears  Nose:   Nares normal, septum midline, mucosa normal, no drainage    or sinus tenderness  Throat:   Lips, mucosa, and tongue normal; teeth and gums normal  Neck:   Supple, symmetrical, trachea midline, no adenopathy;    thyroid :  no enlargement/tenderness/nodules  Back:     Symmetric, no curvature, ROM normal, no CVA tenderness  Lungs:     respirations unlabored  Chest Wall:    No tenderness or deformity   Heart:    Regular rate and rhythm  Breast Exam:    No tenderness, masses, or nipple abnormality  Abdomen:     Soft, non-tender, bowel sounds active all four quadrants,    no masses, no organomegaly  Genitalia:     Normal female without lesion, discharge or tenderness     Extremities:   Extremities normal, atraumatic, no cyanosis or edema  Pulses:   2+ and symmetric all extremities  Skin:   Skin color, texture, turgor normal, no rashes or lesions     Assessment:    Healthy female exam.    Plan:  Erika Dennis was seen today for gynecologic exam.  Diagnoses and all orders for this visit:  Well female exam with routine gynecological exam -     Cervicovaginal ancillary only -     RPR+HBsAg+HCVAb+... -     TSH -     Hemoglobin A1c -     CBC -     Comprehensive metabolic panel with GFR -     Lipid panel  Screening for STD (sexually transmitted disease) -     Cervicovaginal ancillary only -     RPR+HBsAg+HCVAb+...  Amenorrhea -     POCT urine pregnancy  BMI 50.0-59.9, adult (HCC) -     TSH -     Hemoglobin A1c -     Lipid panel  Other orders -     RPR+HBsAg+HCVAb+...   Needs referral to Dr. Antonio   F/u in 1 year or sooner prn   Erika Dennis L.  Erika Dennis, M.D., Erika Dennis /

## 2023-10-21 LAB — CERVICOVAGINAL ANCILLARY ONLY
Candida Glabrata: NEGATIVE
Candida Vaginitis: POSITIVE — AB
Chlamydia: NEGATIVE
Comment: NEGATIVE
Neisseria Gonorrhea: NEGATIVE

## 2023-10-22 LAB — CERVICOVAGINAL ANCILLARY ONLY
Bacterial Vaginitis (gardnerella): NEGATIVE
Comment: NEGATIVE
Comment: NEGATIVE
Comment: NORMAL
Comment: NEGATIVE
Comment: NEGATIVE
Trichomonas: NEGATIVE

## 2023-10-25 ENCOUNTER — Telehealth: Payer: Self-pay

## 2023-10-25 DIAGNOSIS — B379 Candidiasis, unspecified: Secondary | ICD-10-CM

## 2023-10-25 MED ORDER — TERCONAZOLE 0.4 % VA CREA: TOPICAL_CREAM | VAGINAL | 0 refills | Status: AC

## 2023-10-25 NOTE — Telephone Encounter (Signed)
 Called patient to inform her of positive yeast infection results. Per patient's preference, Terconazole insert 1 applicator intravaginally at bedtime x 3 days was sent to her pharmacy. Understanding was voiced. Saul Dorsi l Jaziya Obarr, CMA

## 2023-10-26 ENCOUNTER — Other Ambulatory Visit: Payer: Self-pay | Admitting: Obstetrics & Gynecology

## 2023-10-27 LAB — RPR+HBSAG+HCVAB+...
HIV Screen 4th Generation wRfx: NONREACTIVE
Hep C Virus Ab: NONREACTIVE
Hepatitis B Surface Ag: NEGATIVE
RPR Ser Ql: NONREACTIVE

## 2023-10-27 LAB — TSH: TSH: 1.32 u[IU]/mL (ref 0.450–4.500)

## 2023-10-27 LAB — COMPREHENSIVE METABOLIC PANEL WITH GFR
ALT: 16 IU/L (ref 0–32)
AST: 19 IU/L (ref 0–40)
Albumin: 4.3 g/dL (ref 3.9–4.9)
Alkaline Phosphatase: 88 IU/L (ref 44–121)
BUN/Creatinine Ratio: 26 — ABNORMAL HIGH (ref 9–23)
BUN: 18 mg/dL (ref 6–20)
Bilirubin Total: 0.3 mg/dL (ref 0.0–1.2)
CO2: 21 mmol/L (ref 20–29)
Calcium: 9.4 mg/dL (ref 8.7–10.2)
Chloride: 104 mmol/L (ref 96–106)
Creatinine, Ser: 0.69 mg/dL (ref 0.57–1.00)
Globulin, Total: 2.9 g/dL (ref 1.5–4.5)
Glucose: 99 mg/dL (ref 70–99)
Potassium: 4.7 mmol/L (ref 3.5–5.2)
Sodium: 139 mmol/L (ref 134–144)
Total Protein: 7.2 g/dL (ref 6.0–8.5)
eGFR: 119 mL/min/{1.73_m2} (ref >59)

## 2023-10-27 LAB — LIPID PANEL
Chol/HDL Ratio: 3.1 ratio (ref 0.0–4.4)
Cholesterol, Total: 170 mg/dL (ref 100–199)
HDL: 54 mg/dL (ref >39)
LDL Chol Calc (NIH): 101 mg/dL — ABNORMAL HIGH (ref 0–99)
Triglycerides: 78 mg/dL (ref 0–149)
VLDL Cholesterol Cal: 15 mg/dL (ref 5–40)

## 2023-10-27 LAB — CBC
Hematocrit: 42.3 % (ref 34.0–46.6)
Hemoglobin: 13.7 g/dL (ref 11.1–15.9)
MCH: 29 pg (ref 26.6–33.0)
MCHC: 32.4 g/dL (ref 31.5–35.7)
MCV: 89 fL (ref 79–97)
Platelets: 314 10*3/uL (ref 150–450)
RBC: 4.73 x10E6/uL (ref 3.77–5.28)
RDW: 12.2 % (ref 11.7–15.4)
WBC: 4.6 10*3/uL (ref 3.4–10.8)

## 2023-10-27 LAB — HEMOGLOBIN A1C
Est. average glucose Bld gHb Est-mCnc: 108 mg/dL
Hgb A1c MFr Bld: 5.4 % (ref 4.8–5.6)

## 2023-10-31 ENCOUNTER — Other Ambulatory Visit: Payer: Self-pay | Admitting: Obstetrics & Gynecology

## 2023-10-31 ENCOUNTER — Ambulatory Visit: Payer: Self-pay | Admitting: Obstetrics & Gynecology

## 2023-11-01 ENCOUNTER — Other Ambulatory Visit: Payer: Self-pay

## 2023-11-01 LAB — POCT URINE PREGNANCY: Preg Test, Ur: NEGATIVE

## 2024-03-16 ENCOUNTER — Ambulatory Visit: Payer: Self-pay | Admitting: Family Medicine
